# Patient Record
Sex: Female | Born: 1960 | Race: White | Hispanic: No | Marital: Married | Smoking: Never smoker
Health system: Southern US, Community
[De-identification: ages and names within clinical notes are randomized; demographics above are authoritative.]

## PROBLEM LIST (undated history)

## (undated) DIAGNOSIS — E785 Hyperlipidemia, unspecified: Secondary | ICD-10-CM

## (undated) DIAGNOSIS — E039 Hypothyroidism, unspecified: Secondary | ICD-10-CM

## (undated) DIAGNOSIS — M5030 Other cervical disc degeneration, unspecified cervical region: Secondary | ICD-10-CM

## (undated) DIAGNOSIS — J309 Allergic rhinitis, unspecified: Secondary | ICD-10-CM

## (undated) DIAGNOSIS — N200 Calculus of kidney: Secondary | ICD-10-CM

## (undated) HISTORY — PX: DILATION AND CURETTAGE OF UTERUS: SHX78

---

## 1998-07-07 ENCOUNTER — Inpatient Hospital Stay (HOSPITAL_COMMUNITY): Admission: AD | Admit: 1998-07-07 | Discharge: 1998-07-07 | Payer: Self-pay | Admitting: *Deleted

## 1999-06-20 ENCOUNTER — Other Ambulatory Visit: Admission: RE | Admit: 1999-06-20 | Discharge: 1999-06-20 | Payer: Self-pay | Admitting: Emergency Medicine

## 1999-10-21 ENCOUNTER — Encounter: Admission: RE | Admit: 1999-10-21 | Discharge: 2000-01-19 | Payer: Self-pay | Admitting: *Deleted

## 1999-12-23 ENCOUNTER — Inpatient Hospital Stay (HOSPITAL_COMMUNITY): Admission: AD | Admit: 1999-12-23 | Discharge: 1999-12-25 | Payer: Self-pay | Admitting: *Deleted

## 2000-02-03 ENCOUNTER — Other Ambulatory Visit: Admission: RE | Admit: 2000-02-03 | Discharge: 2000-02-03 | Payer: Self-pay | Admitting: Obstetrics and Gynecology

## 2000-03-24 ENCOUNTER — Encounter (INDEPENDENT_AMBULATORY_CARE_PROVIDER_SITE_OTHER): Payer: Self-pay

## 2000-03-24 ENCOUNTER — Other Ambulatory Visit: Admission: RE | Admit: 2000-03-24 | Discharge: 2000-03-24 | Payer: Self-pay | Admitting: Obstetrics and Gynecology

## 2000-04-21 ENCOUNTER — Encounter (INDEPENDENT_AMBULATORY_CARE_PROVIDER_SITE_OTHER): Payer: Self-pay | Admitting: Specialist

## 2000-04-21 ENCOUNTER — Other Ambulatory Visit: Admission: RE | Admit: 2000-04-21 | Discharge: 2000-04-21 | Payer: Self-pay | Admitting: Obstetrics and Gynecology

## 2000-09-04 ENCOUNTER — Other Ambulatory Visit: Admission: RE | Admit: 2000-09-04 | Discharge: 2000-09-04 | Payer: Self-pay | Admitting: Obstetrics and Gynecology

## 2001-03-29 ENCOUNTER — Encounter: Admission: RE | Admit: 2001-03-29 | Discharge: 2001-04-28 | Payer: Self-pay | Admitting: Family Medicine

## 2001-05-04 ENCOUNTER — Other Ambulatory Visit: Admission: RE | Admit: 2001-05-04 | Discharge: 2001-05-04 | Payer: Self-pay | Admitting: Obstetrics and Gynecology

## 2001-07-20 ENCOUNTER — Ambulatory Visit (HOSPITAL_COMMUNITY): Admission: AD | Admit: 2001-07-20 | Discharge: 2001-07-20 | Payer: Self-pay | Admitting: Obstetrics and Gynecology

## 2001-07-20 ENCOUNTER — Encounter (INDEPENDENT_AMBULATORY_CARE_PROVIDER_SITE_OTHER): Payer: Self-pay | Admitting: *Deleted

## 2001-11-02 ENCOUNTER — Other Ambulatory Visit: Admission: RE | Admit: 2001-11-02 | Discharge: 2001-11-02 | Payer: Self-pay | Admitting: Obstetrics and Gynecology

## 2002-07-05 ENCOUNTER — Encounter: Admission: RE | Admit: 2002-07-05 | Discharge: 2002-07-05 | Payer: Self-pay | Admitting: Obstetrics and Gynecology

## 2002-07-21 ENCOUNTER — Inpatient Hospital Stay (HOSPITAL_COMMUNITY): Admission: AD | Admit: 2002-07-21 | Discharge: 2002-07-21 | Payer: Self-pay | Admitting: Obstetrics and Gynecology

## 2002-07-22 ENCOUNTER — Inpatient Hospital Stay (HOSPITAL_COMMUNITY): Admission: AD | Admit: 2002-07-22 | Discharge: 2002-07-22 | Payer: Self-pay | Admitting: Obstetrics and Gynecology

## 2002-09-08 ENCOUNTER — Inpatient Hospital Stay (HOSPITAL_COMMUNITY): Admission: AD | Admit: 2002-09-08 | Discharge: 2002-09-10 | Payer: Self-pay | Admitting: Obstetrics and Gynecology

## 2002-10-24 ENCOUNTER — Other Ambulatory Visit: Admission: RE | Admit: 2002-10-24 | Discharge: 2002-10-24 | Payer: Self-pay | Admitting: Obstetrics and Gynecology

## 2005-09-02 ENCOUNTER — Encounter: Admission: RE | Admit: 2005-09-02 | Discharge: 2005-09-02 | Payer: Self-pay | Admitting: Obstetrics and Gynecology

## 2006-09-07 ENCOUNTER — Encounter: Admission: RE | Admit: 2006-09-07 | Discharge: 2006-09-07 | Payer: Self-pay | Admitting: Obstetrics and Gynecology

## 2010-08-23 NOTE — Op Note (Signed)
[facility]  Patient:    CATALEAH, STITES Visit Number: [id number] MRN: [id number]          Service Type: OBS Location: [facility] Attending Physician:  Lenoard Aden Dictated by:   Lenoard Aden, M.D. Proc. Date: 07/20/01 Admit Date:  07/20/2001                             Operative Report  POSTOPERATIVE DIAGNOSIS:      Incomplete abortion.  POSTOPERATIVE DIAGNOSIS:      Incomplete abortion.  PROCEDURE:                    Suction dilatation and curettage.  SURGEON:                      Lenoard Aden, M.D.  ANESTHESIA:                   Spinal by Ellwood Handler, M.D.  ESTIMATED BLOOD LOSS:         100 cc.  COMPLICATIONS:                None.  The patient to the recovery room in good condition.  SPECIMENS:                    Products of conception to pathology.  DESCRIPTION OF PROCEDURE:     The patient is rushed to the operating room urgently for heavy persistent vaginal bleeding, in the process of an incomplete abortion.  Approximately 200 cc noted prior to proceeding to the operating room.  Under spinal anesthetic the patient was prepped and draped in the usual sterile fashion.  Catheterized until the bladder was emptied. Examination under anesthesia reveals a midposition to retroflexed enlarged uterus.  The cervix easily dilates to a #27 Pratt dilator after 20 cc of dilute 2% Xylocaine solution used for a paracervical block.  The patient tolerating the procedure well.  At this time an 8.0 mm suction curet was placed.  A moderate amount of tissue is aspirated and sent for pathologic confirmation of products of conception and aspiration, in a four-quadrant method.  This reveals the cavity to be empty.  A blunt curettage in a four-quadrant method also reveals the cavity to be empty.  A repeat suction and curettage confirms.  Good hemostasis is confirmed.  The single tooth tenaculum and the speculum are removed.  The patient tolerates  the procedure well and was transferred to the recovery room in good condition. Dictated by:   Lenoard Aden, M.D. Attending Physician:  Lenoard Aden DD:  07/20/01 TD:  07/20/01 Job: [id number]

## 2010-08-23 NOTE — H&P (Signed)
[facility]  PatientZONA, Rebekah Thompson                            MRN: [id number] Adm. Date:  12/23/99 Attending:  Lenoard Aden, M.D. CC:         [facility]   History and Physical  CHIEF COMPLAINT:              Gestational diabetes, advanced dilatation at term.  HISTORY OF PRESENT ILLNESS:   A 50 year old female gravida 1, para 0, EDC of December 21, 1999, at 40 plus weeks who presents for induction for aforementioned indications.  PAST OBSTETRIC HISTORY:       Negative.  PAST GYNECOLOGIC HISTORY:     Negative.  FAMILY HISTORY:               Myocardial infarction, thyroid disease, diverticulitis, renal calculus, schizophrenia, and alcoholism.  PAST MEDICAL HISTORY:         The patient has had no medical or surgical hospitalizations.  PRENATAL CARE:                Remarkable for blood type A-positive, rubella immune.  Hepatitis B surface antigen negative.  HIV nonreactive.  GBS positive.  GC and Chlamydia negative.  Pregnancy complicated by a complex ovarian cyst on the left which appeared stable and was managed expectantly. The patient will have ultrasound postpartum.  PHYSICAL EXAMINATION:  GENERAL:                      She is a well-developed and well-nourished white female in apparent distress.  HEENT:                        Normal.  LUNGS:                        Clear.  HEART:                        Regular rhythm.  ABDOMEN:                      Soft, gravid, and nontender.  Estimated fetal weight 7-1/2 to 8 pounds.  CERVIX:                       Is 3-4 cm, 80% vertex, and minus 1.  Artificial rupture of membranes reveals clear fluid.  NEUROLOGIC EXAMINATION:       Intact.  EXTREMITIES:                  Showed no cords.  IMPRESSION:                   1. A 40 week intrauterine pregnancy.                               2. Gestational diabetes.                               3. Group B streptococcus positive.                     4. Ovarian mass.  5. A soft favorable cervix at term.  PLAN:                         Proceed with induction.  The patient is apprised of the risk of induction and proceeding with possible vaginal delivery.  She acknowledges and desires to proceed. DD:  12/23/99 TD:  12/23/99 Job: [id number]

## 2010-08-23 NOTE — Op Note (Signed)
   NAMEROSAMAE, ROCQUE                            ACCOUNT NO.:  [id number]   MEDICAL RECORD NO.:  [id number]                   PATIENT TYPE:  INP   LOCATION:  9126                                 FACILITY:  [facility]   PHYSICIAN:  Lenoard Aden, M.D.             DATE OF BIRTH:  1961/01/18   DATE OF PROCEDURE:  09/08/2002  DATE OF DISCHARGE:                                 OPERATIVE REPORT   OPERATIVE DELIVERY NOTE:   INDICATION FOR OPERATIVE DELIVERY:  Maternal exhaustion, prolonged second  stage.   DESCRIPTION OF PROCEDURE:  After pushing for approximately three hours  intermittently, fetal vertex noted to be ROA, less than 35 degrees, at +3  station.  A Kiwi cup is placed for low vacuum-assisted vaginal delivery on  two pulls of a full-term living female, Apgars 8 and 9.  Before the procedure,  vacuum assistance was discussed with the patient and her husbands, the risks  of cephalohematoma, scalp laceration, small incidence of intracranial  hemorrhage discussed.  They acknowledged and wished to proceed.  At the end  of the procedure the estimated blood loss was 400 mL.  No cervical or  vaginal lacerations repaired.  The midline laceration with a 3-0 Rapide and  a 4-0 Vicryl without complications.  Mother and baby tolerated the procedure  well and are recovering in good condition.                                               Lenoard Aden, M.D.    RJT/MEDQ  D:  09/08/2002  T:  09/09/2002  Job:  [id number]

## 2010-08-23 NOTE — H&P (Signed)
   Rebekah Thompson, Rebekah Thompson                            ACCOUNT NO.:  [id number]   MEDICAL RECORD NO.:  [id number]                   PATIENT TYPE:  MAT   LOCATION:  [facility]                                 FACILITY:  [facility]   PHYSICIAN:  Lenoard Aden, M.D.             DATE OF BIRTH:  05-09-1960   DATE OF ADMISSION:  09/08/2002  DATE OF DISCHARGE:                                HISTORY & PHYSICAL   CHIEF COMPLAINT:  Advanced dilatation and gestational diabetes for  induction.   HISTORY OF PRESENT ILLNESS:  The patient is a 50 year old white female, G3,  P1, EDD September 19, 2002, at 38 weeks, with a history of well controlled  gestational diabetes and advanced cervical dilatation for controlled  attempts at induction.   ALLERGIES:  SULFA.   MEDICATIONS:  1. Prenatal vitamins.  2. Levoxyl.   PAST MEDICAL HISTORY:  1. Hypothyroidism.  2. Abnormal Pap smear with loop electrosurgical excision procedure in 2002.  3. D&C for missed AB in 2003.   Pregnancy has been complicated by a pre-term cervical change, followed  expectantly, history of third trimester steroid administration.   PAST OBSTETRICAL HISTORY:  1. An 8 pound 12 ounce female born in 2001.  2. D&C status post SAB in April 2003.   LABORATORY DATA:  A blood type of A positive, Rh antibody negative, rubella  immune.  Hepatitis B surface antigen negative.  HIV declined.   PHYSICAL EXAMINATION:  GENERAL:  She is a well-developed, well-nourished  white female in no acute distress.  HEENT:  Normal.  LUNGS:  Clear.  HEART:  Regular rate and rhythm.  ABDOMEN:  Soft, gravid, nontender.  PELVIC:  Estimated fetal weight 8.5 to 9 pounds.  Cervix is 4 to 5 cm, 1 cm  long, vertex, and -1.  EXTREMITIES:  No cords.  NEUROLOGIC:  Nonfocal.    IMPRESSION:  A 38-week intrauterine pregnancy for induction.  1. Gestational diabetes, stable.  2. Advanced dilatation.   PLAN:  Proceed with artificial rupture of membranes and attempts at  vaginal  delivery.                                                Lenoard Aden, M.D.    RJT/MEDQ  D:  09/07/2002  T:  09/07/2002  Job:  [id number]

## 2012-06-15 ENCOUNTER — Ambulatory Visit: Payer: Self-pay | Admitting: Obstetrics and Gynecology

## 2012-12-08 ENCOUNTER — Encounter: Payer: Self-pay | Admitting: General Practice

## 2013-01-04 ENCOUNTER — Ambulatory Visit: Payer: Self-pay | Admitting: General Practice

## 2013-01-04 LAB — HCG, QUANTITATIVE, PREGNANCY: Beta Hcg, Quant.: 1 m[IU]/mL

## 2013-01-05 ENCOUNTER — Encounter: Payer: Self-pay | Admitting: General Practice

## 2013-01-28 ENCOUNTER — Ambulatory Visit: Payer: Self-pay | Admitting: General Practice

## 2013-02-05 ENCOUNTER — Encounter: Payer: Self-pay | Admitting: General Practice

## 2015-08-17 ENCOUNTER — Other Ambulatory Visit: Payer: Self-pay | Admitting: Family Medicine

## 2015-08-17 DIAGNOSIS — M542 Cervicalgia: Secondary | ICD-10-CM

## 2015-08-17 DIAGNOSIS — M5030 Other cervical disc degeneration, unspecified cervical region: Secondary | ICD-10-CM

## 2015-09-06 ENCOUNTER — Ambulatory Visit
Admission: RE | Admit: 2015-09-06 | Discharge: 2015-09-06 | Disposition: A | Discharge status: Home / Self Care | Payer: BLUE CROSS/BLUE SHIELD | Source: Ambulatory Visit | Attending: Family Medicine | Admitting: Family Medicine

## 2015-09-06 DIAGNOSIS — M542 Cervicalgia: Secondary | ICD-10-CM | POA: Diagnosis present

## 2015-09-06 DIAGNOSIS — M5010 Cervical disc disorder with radiculopathy, unspecified cervical region: Secondary | ICD-10-CM | POA: Diagnosis present

## 2015-09-06 DIAGNOSIS — M5124 Other intervertebral disc displacement, thoracic region: Secondary | ICD-10-CM | POA: Insufficient documentation

## 2015-09-06 DIAGNOSIS — M50223 Other cervical disc displacement at C6-C7 level: Secondary | ICD-10-CM | POA: Diagnosis not present

## 2015-09-06 DIAGNOSIS — M47812 Spondylosis without myelopathy or radiculopathy, cervical region: Secondary | ICD-10-CM | POA: Insufficient documentation

## 2015-09-06 DIAGNOSIS — M5030 Other cervical disc degeneration, unspecified cervical region: Secondary | ICD-10-CM

## 2015-09-25 ENCOUNTER — Encounter: Payer: Self-pay | Admitting: *Deleted

## 2015-09-26 ENCOUNTER — Ambulatory Visit: Payer: BLUE CROSS/BLUE SHIELD | Admitting: Anesthesiology

## 2015-09-26 ENCOUNTER — Ambulatory Visit
Admission: RE | Admit: 2015-09-26 | Discharge: 2015-09-26 | Disposition: A | Discharge status: Home / Self Care | Payer: BLUE CROSS/BLUE SHIELD | Source: Ambulatory Visit | Attending: Unknown Physician Specialty | Admitting: Unknown Physician Specialty

## 2015-09-26 ENCOUNTER — Encounter: Payer: Self-pay | Admitting: *Deleted

## 2015-09-26 ENCOUNTER — Encounter
Admission: RE | Disposition: A | Discharge status: Home / Self Care | Payer: Self-pay | Source: Ambulatory Visit | Attending: Unknown Physician Specialty

## 2015-09-26 DIAGNOSIS — E785 Hyperlipidemia, unspecified: Secondary | ICD-10-CM | POA: Diagnosis not present

## 2015-09-26 DIAGNOSIS — K640 First degree hemorrhoids: Secondary | ICD-10-CM | POA: Insufficient documentation

## 2015-09-26 DIAGNOSIS — Z79899 Other long term (current) drug therapy: Secondary | ICD-10-CM | POA: Diagnosis not present

## 2015-09-26 DIAGNOSIS — Z1211 Encounter for screening for malignant neoplasm of colon: Secondary | ICD-10-CM | POA: Diagnosis not present

## 2015-09-26 DIAGNOSIS — E039 Hypothyroidism, unspecified: Secondary | ICD-10-CM | POA: Insufficient documentation

## 2015-09-26 HISTORY — DX: Other cervical disc degeneration, unspecified cervical region: M50.30

## 2015-09-26 HISTORY — DX: Allergic rhinitis, unspecified: J30.9

## 2015-09-26 HISTORY — DX: Hyperlipidemia, unspecified: E78.5

## 2015-09-26 HISTORY — DX: Calculus of kidney: N20.0

## 2015-09-26 HISTORY — PX: COLONOSCOPY WITH PROPOFOL: SHX5780

## 2015-09-26 HISTORY — DX: Hypothyroidism, unspecified: E03.9

## 2015-09-26 SURGERY — COLONOSCOPY WITH PROPOFOL
Anesthesia: General

## 2015-09-26 MED ORDER — SODIUM CHLORIDE 0.9 % IV SOLN
INTRAVENOUS | Status: DC
  Administered 2015-09-26: 1000 mL via INTRAVENOUS

## 2015-09-26 MED ORDER — LIDOCAINE 2% (20 MG/ML) 5 ML SYRINGE
INTRAMUSCULAR | Status: DC | PRN
Start: 2015-09-26 — End: 2015-09-26
  Administered 2015-09-26: 40 mg via INTRAVENOUS

## 2015-09-26 MED ORDER — PROPOFOL 500 MG/50ML IV EMUL
INTRAVENOUS | Status: DC | PRN
  Administered 2015-09-26: 150 ug/kg/min via INTRAVENOUS

## 2015-09-26 MED ORDER — MIDAZOLAM HCL 5 MG/5ML IJ SOLN
INTRAMUSCULAR | Status: DC | PRN
  Administered 2015-09-26: 1 mg via INTRAVENOUS

## 2015-09-26 MED ORDER — SODIUM CHLORIDE 0.9 % IV SOLN
INTRAVENOUS | Status: DC
  Administered 2015-09-26: 11:00:00 via INTRAVENOUS

## 2015-09-26 MED ORDER — PROPOFOL 10 MG/ML IV BOLUS
INTRAVENOUS | Status: DC | PRN
  Administered 2015-09-26: 100 mg via INTRAVENOUS

## 2015-09-26 MED ORDER — FENTANYL CITRATE (PF) 100 MCG/2ML IJ SOLN
INTRAMUSCULAR | Status: DC | PRN
  Administered 2015-09-26: 50 ug via INTRAVENOUS

## 2015-09-26 MED ORDER — PHENYLEPHRINE HCL 10 MG/ML IJ SOLN
INTRAMUSCULAR | Status: DC | PRN
  Administered 2015-09-26 (×3): 100 ug via INTRAVENOUS

## 2015-09-26 NOTE — Op Note (Addendum)
[facility] Gastroenterology Patient Name: Rebekah Thompson Procedure Date: 09/26/2015 10:56 AM MRN: [id number] Account #: [id number] Date of Birth: 26-Nov-1960 Admit Type: Outpatient Age: [age] [facility] 4 Gender: Female Note Status: Finalized Procedure:            Colonoscopy Indications:          Screening for colorectal malignant neoplasm Providers:            Scot Junobert T. Elliott, MD Referring MD:         Gilles ChiquitoJoseph H. Rabinowitz, MD Medicines:            Propofol per Anesthesia Complications:        No immediate complications. Procedure:            Pre-Anesthesia Assessment:                       - After reviewing the risks and benefits, the patient                        was deemed in satisfactory condition to undergo the                        procedure.                       After obtaining informed consent, the colonoscope was                        passed under direct vision. Throughout the procedure,                        the patient's blood pressure, pulse, and oxygen                        saturations were monitored continuously. The                        Colonoscope was introduced through the anus and                        advanced to the the cecum, identified by appendiceal                        orifice and ileocecal valve. The colonoscopy was                        performed without difficulty. The patient tolerated the                        procedure well. The quality of the bowel preparation                        was excellent. Findings:      Internal hemorrhoids were found during endoscopy. The hemorrhoids were       small and Grade I (internal hemorrhoids that do not prolapse).      The exam was otherwise without abnormality. Impression:           - Internal hemorrhoids.                       - The examination was otherwise normal.                       -  No specimens collected. Recommendation:       - Repeat colonoscopy in 10 years for  screening purposes. Procedure Code(s):    --- Professional ---                       [phone], Colonoscopy, flexible; diagnostic, including                        collection of specimen(s) by brushing or washing, when                        performed (separate procedure) Diagnosis Code(s):    --- Professional ---                       Z12.11, Encounter for screening for malignant neoplasm                        of colon                       K64.0, First degree hemorrhoids CPT copyright 2016 American Medical Association. All rights reserved. The codes documented in this report are preliminary and upon coder review may  be revised to meet current compliance requirements. Scot Jun, MD 09/26/2015 11:25:25 AM This report has been signed electronically. Number of Addenda: 0 Note Initiated On: 09/26/2015 10:56 AM Scope Withdrawal Time: 0 hours 10 minutes 5 seconds  Total Procedure Duration: 0 hours 20 minutes 1 second       [facility]

## 2015-09-26 NOTE — Anesthesia Postprocedure Evaluation (Signed)
Anesthesia Post Note  Patient: Rebekah Thompson  Procedure(s) Performed: Procedure(s) (LRB): COLONOSCOPY WITH PROPOFOL (N/A)  Patient location during evaluation: PACU Anesthesia Type: General Level of consciousness: awake Pain management: satisfactory to patient Vital Signs Assessment: post-procedure vital signs reviewed and stable Respiratory status: nonlabored ventilation Cardiovascular status: blood pressure returned to baseline Anesthetic complications: no    Last Vitals:  Filed Vitals:   09/26/15 1138 09/26/15 1148  BP: 93/51 103/51  Pulse: 65 68  Temp:    Resp: 22 14    Last Pain:  Filed Vitals:   09/26/15 1150  PainSc: 7                  VAN STAVEREN,Ehren Berisha

## 2015-09-26 NOTE — H&P (Signed)
   Primary Care Physician:  Gilles ChiquitoJoseph H Rabinowitz, MD Primary Gastroenterologist:  Dr. Mechele CollinElliott  Pre-Procedure History & Physical: HPI:  Rebekah Thompson is a 55 y.o. female is here for an colonoscopy.   Past Medical History  Diagnosis Date  . Hypothyroidism   . DDD (degenerative disc disease), cervical   . Hyperlipidemia   . Allergic rhinitis   . Kidney stones     Past Surgical History  Procedure Laterality Date  . Dilation and curettage of uterus      Prior to Admission medications   Medication Sig Start Date End Date Taking? Authorizing Provider  levothyroxine (LEVOXYL) 100 MCG tablet Take 100 mcg by mouth daily before breakfast.   Yes Historical Provider, MD  meloxicam (MOBIC) 15 MG tablet Take 15 mg by mouth daily.   Yes Historical Provider, MD  rosuvastatin (CRESTOR) 5 MG tablet Take 5 mg by mouth daily.   Yes Historical Provider, MD    Allergies as of 09/07/2015  . (Not on File)    History reviewed. No pertinent family history.  Social History   Social History  . Marital Status: Married    Spouse Name: N/A  . Number of Children: N/A  . Years of Education: N/A   Occupational History  . Not on file.   Social History Main Topics  . Smoking status: Never Smoker   . Smokeless tobacco: Not on file  . Alcohol Use: Not on file  . Drug Use: Not on file  . Sexual Activity: Not on file   Other Topics Concern  . Not on file   Social History Narrative    Review of Systems: See HPI, otherwise negative ROS  Physical Exam: BP 109/66 mmHg  Pulse 82  Temp(Src) 98.6 F (37 C) (Tympanic)  Resp 16  Ht 5\' 5"  (1.651 m)  Wt 59.421 kg (131 lb)  BMI 21.80 kg/m2  SpO2 100% General:   Alert,  pleasant and cooperative in NAD Head:  Normocephalic and atraumatic. Neck:  Supple; no masses or thyromegaly. Lungs:  Clear throughout to auscultation.    Heart:  Regular rate and rhythm. Abdomen:  Soft, nontender and nondistended. Normal bowel sounds, without guarding, and  without rebound.   Neurologic:  Alert and  oriented x4;  grossly normal neurologically.  Impression/Plan: Rebekah Thompson is here for an colonoscopy to be performed for screening colonoscopy  Risks, benefits, limitations, and alternatives regarding  colonoscopy have been reviewed with the patient.  Questions have been answered.  All parties agreeable.   Lynnae PrudeELLIOTT, ROBERT, MD  09/26/2015, 10:49 AM

## 2015-09-26 NOTE — Transfer of Care (Signed)
Immediate Anesthesia Transfer of Care Note  Patient: Rebekah Thompson  Procedure(s) Performed: Procedure(s): COLONOSCOPY WITH PROPOFOL (N/A)  Patient Location: PACU and Endoscopy Unit  Anesthesia Type:General  Level of Consciousness: awake and sedated  Airway & Oxygen Therapy: Patient Spontanous Breathing and Patient connected to nasal cannula oxygen  Post-op Assessment: Report given to RN and Post -op Vital signs reviewed and stable  Post vital signs: Reviewed and stable  Last Vitals:  Filed Vitals:   09/26/15 1037 09/26/15 1128  BP: 109/66   Pulse: 82   Temp: 37 C 35.9 C  Resp: 16     Last Pain:  Filed Vitals:   09/26/15 1129  PainSc: 7          Complications: No apparent anesthesia complications

## 2015-09-26 NOTE — Anesthesia Preprocedure Evaluation (Signed)
Anesthesia Evaluation  Patient identified by MRN, date of birth, ID band Patient awake    Reviewed: Allergy & Precautions, NPO status , Patient's Chart, lab work & pertinent test results  History of Anesthesia Complications Negative for: history of anesthetic complications  Airway Mallampati: I       Dental  (+) Teeth Intact   Pulmonary neg pulmonary ROS,    + rhonchi        Cardiovascular negative cardio ROS   Rhythm:Regular     Neuro/Psych negative neurological ROS     GI/Hepatic   Endo/Other  Hypothyroidism   Renal/GU   negative genitourinary   Musculoskeletal   Abdominal Normal abdominal exam  (+)   Peds negative pediatric ROS (+)  Hematology negative hematology ROS (+)   Anesthesia Other Findings   Reproductive/Obstetrics                             Anesthesia Physical Anesthesia Plan  ASA: II  Anesthesia Plan: General   Post-op Pain Management:    Induction: Intravenous  Airway Management Planned: Natural Airway and Nasal Cannula  Additional Equipment:   Intra-op Plan:   Post-operative Plan:   Informed Consent: I have reviewed the patients History and Physical, chart, labs and discussed the procedure including the risks, benefits and alternatives for the proposed anesthesia with the patient or authorized representative who has indicated his/her understanding and acceptance.     Plan Discussed with: CRNA  Anesthesia Plan Comments:         Anesthesia Quick Evaluation

## 2015-09-27 ENCOUNTER — Encounter: Payer: Self-pay | Admitting: Unknown Physician Specialty

## 2015-12-11 ENCOUNTER — Other Ambulatory Visit: Payer: Self-pay | Admitting: Obstetrics and Gynecology

## 2015-12-11 DIAGNOSIS — R928 Other abnormal and inconclusive findings on diagnostic imaging of breast: Secondary | ICD-10-CM

## 2015-12-14 ENCOUNTER — Ambulatory Visit
Admission: RE | Admit: 2015-12-14 | Discharge: 2015-12-14 | Disposition: A | Discharge status: Home / Self Care | Payer: BLUE CROSS/BLUE SHIELD | Source: Ambulatory Visit | Attending: Obstetrics and Gynecology | Admitting: Obstetrics and Gynecology

## 2015-12-14 DIAGNOSIS — R928 Other abnormal and inconclusive findings on diagnostic imaging of breast: Secondary | ICD-10-CM

## 2017-06-20 IMAGING — MR MR CERVICAL SPINE W/O CM
5 series · 34 of 48 positions shown · non-contrast
Comparison: Limited correlation made with paranasal sinus
radiographs 01/28/2013 and brain MRI 01/04/2013.

CLINICAL DATA: Chronic neck pain with painful range of motion,
tingling in the right hand. Worsening symptoms over the last 3
months. No acute injury.

EXAM:
MRI CERVICAL SPINE WITHOUT CONTRAST
TECHNIQUE: Multiplanar, multisequence MR imaging of the cervical spine was
performed. No intravenous contrast was administered.

[Series 2: T2 · sagittal · 3.0mm · 0.56mm/px · 8 of 15 slices shown (1 of 2)]
[im 1/15]
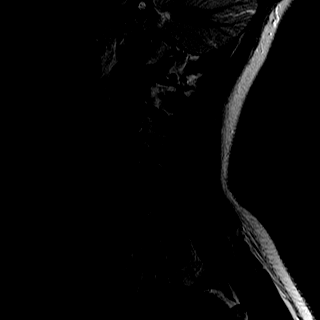
[im 3/15]
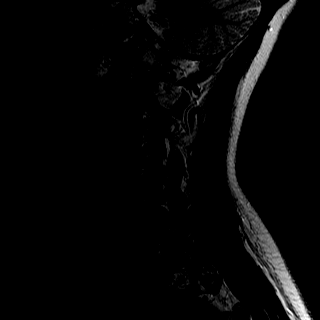
[im 5/15]
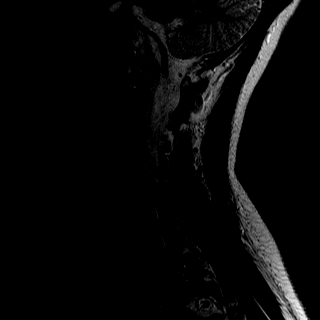
[im 7/15]
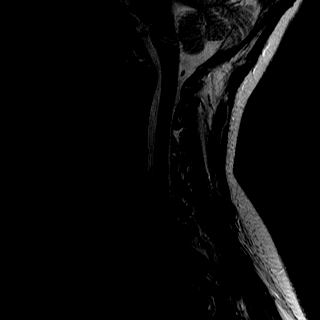
[im 9/15]
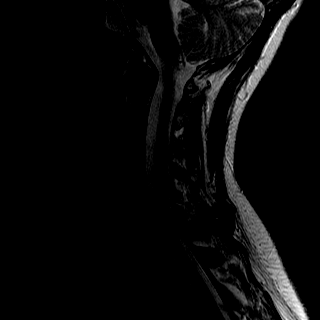
[im 11/15]
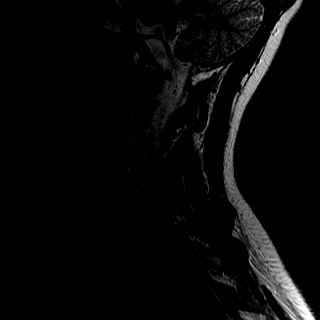
[im 13/15]
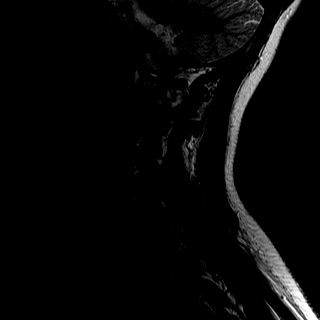
[im 15/15]
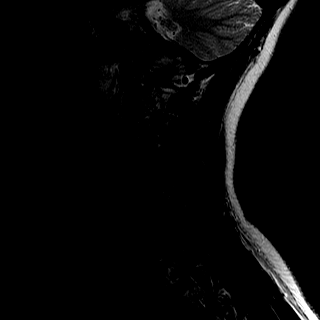

[Series 3: T1 · sagittal · 3.0mm · 0.70mm/px · 7 of 15 slices shown]
[im 1/15]
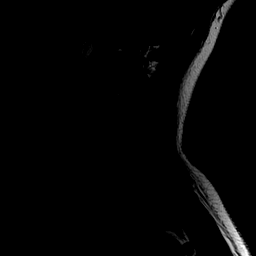
[im 3/15]
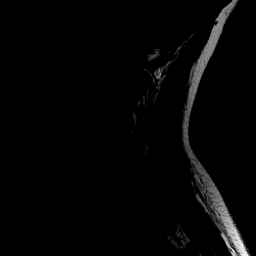
[im 5/15]
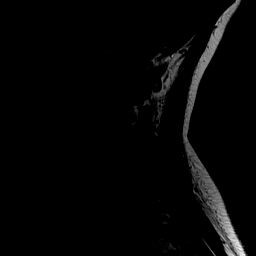
[im 8/15]
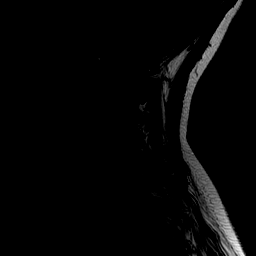
[im 10/15]
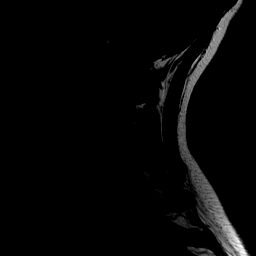
[im 12/15]
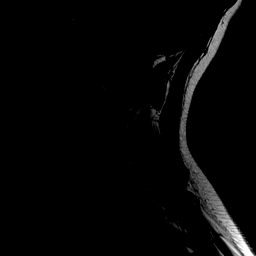
[im 15/15]
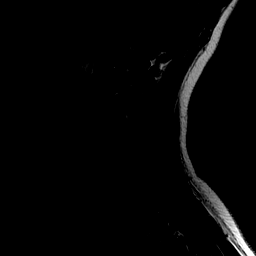

[Series 5: STIR · sagittal · 3.0mm · 0.35mm/px · 7 of 15 slices shown]
[im 1/15]
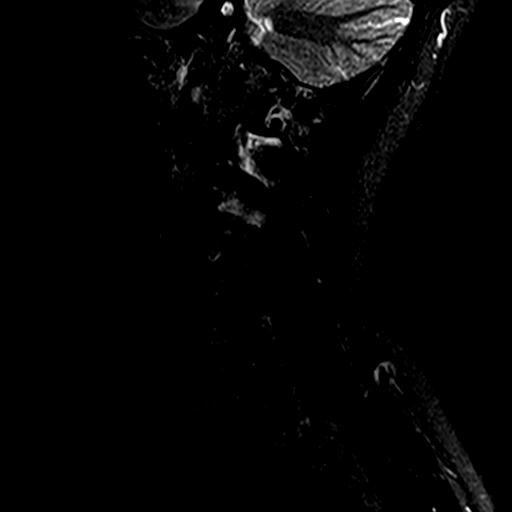
[im 3/15]
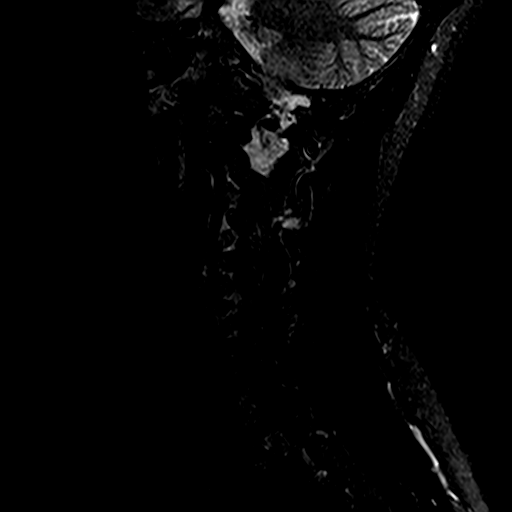
[im 5/15]
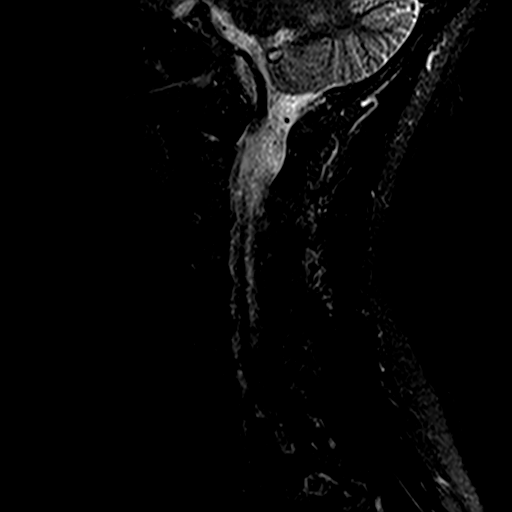
[im 8/15]
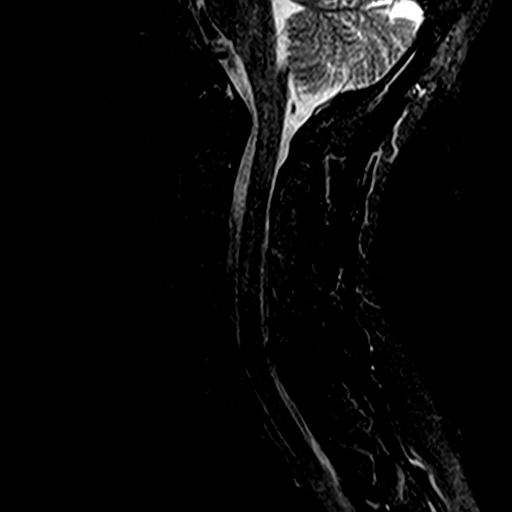
[im 10/15]
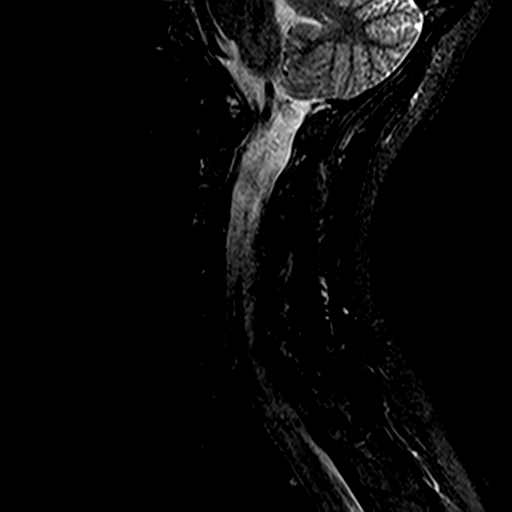
[im 12/15]
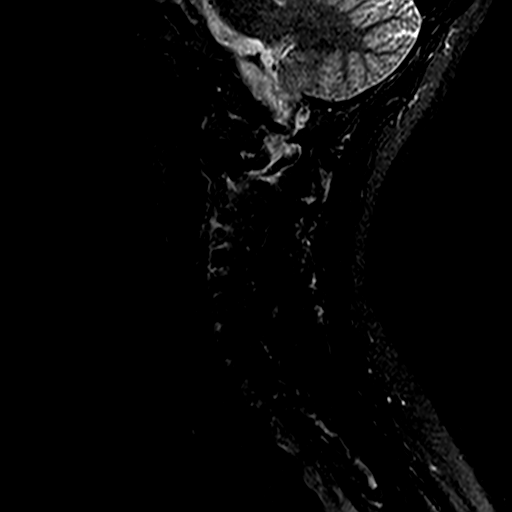
[im 15/15]
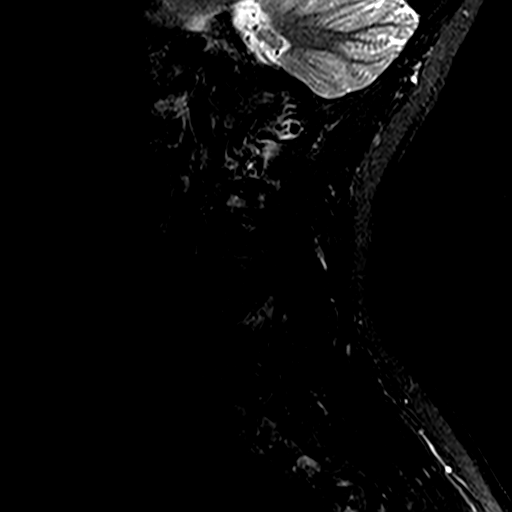

[Series 7: T2 · axial · 3.0mm · 0.70mm/px · z∈[-33,+62]mm · 9 of 26 slices shown (2 of 2)]
[im 1/26]
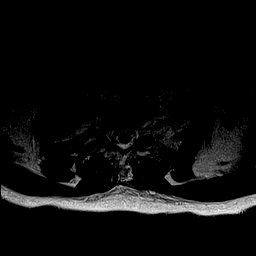
[im 5/26]
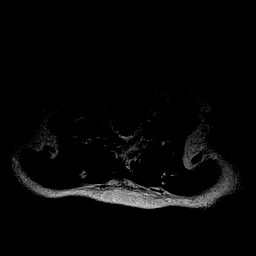
[im 9/26]
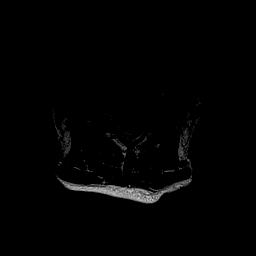
[im 11/26]
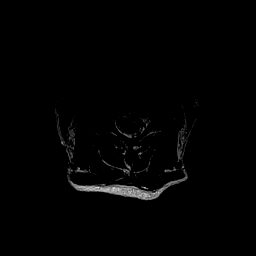
[im 13/26]
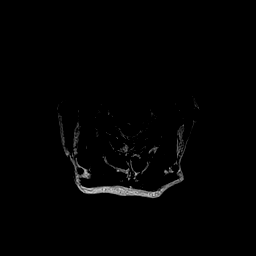
[im 15/26]
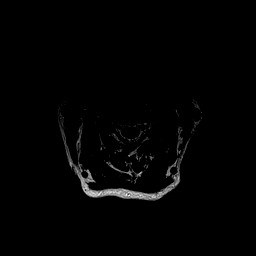
[im 17/26]
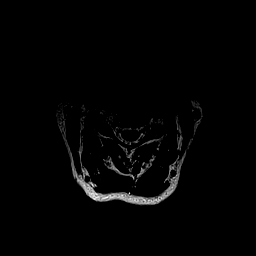
[im 21/26]
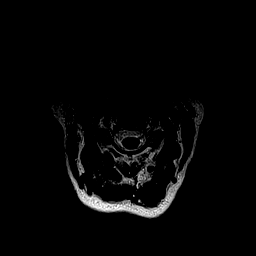
[im 26/26]
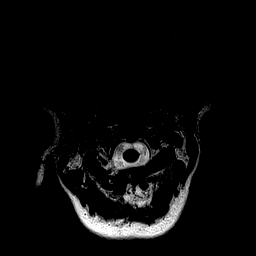

[Series 9: mpgr ax_fil_1 · axial · 3.0mm · 0.35mm/px · z∈[-33,-2]mm · 3 of 26 slices shown]
[im 1/26]
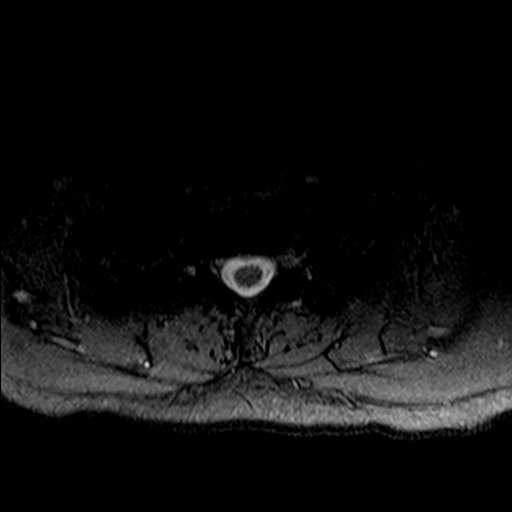
[im 5/26]
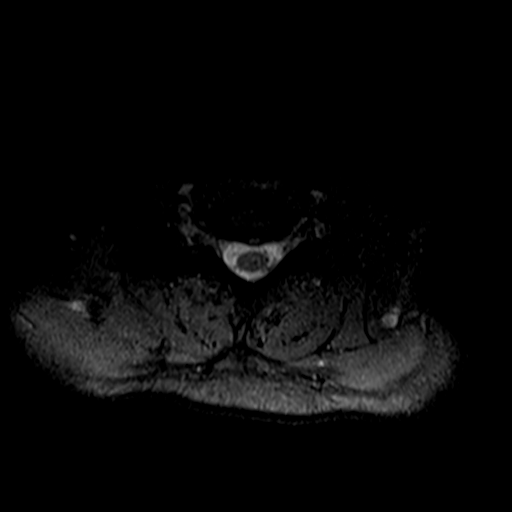
[im 9/26]
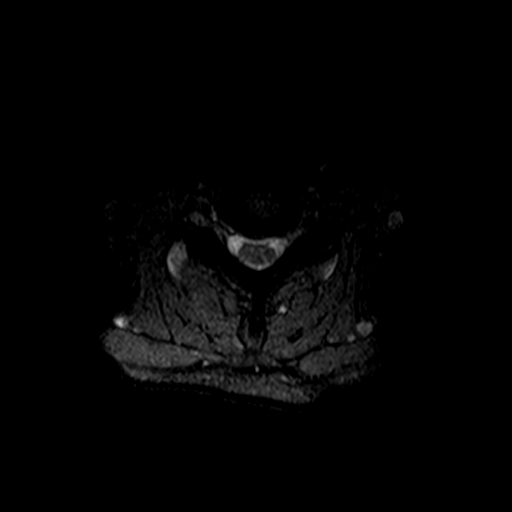

[34 of 48 positions shown; findings below may reference images not displayed]

FINDINGS: Alignment: Normal.

Vertebrae: No acute or suspicious osseous findings.

Cord: Normal in signal and caliber.

Posterior Fossa, vertebral arteries, paraspinal tissues: Visualized
portions of the posterior fossa and paraspinal soft tissues appear
unremarkable. Bilateral vertebral artery flow voids.

Disc levels:

No significant disc space findings at or above C3-4.

C4-5: Minimal disc bulging and uncinate spurring. No cord deformity
or foraminal compromise.

C5-6: There is loss of disc height with posterior osteophytes and
uncinate spurring. Mild foraminal narrowing is present bilaterally.
There is no cord deformity.

C6-7: There is loss of disc height with a shallow central disc
protrusion and mild uncinate spurring. There is mild left foraminal
narrowing. No cord deformity.

C7-T1: Small central disc protrusion. No cord deformity or foraminal
compromise.

T1-2: Small central disc protrusion. No cord deformity or foraminal
compromise.
IMPRESSION: 1. No acute findings. No cord deformity or high-grade foraminal
narrowing.
2. Mild spondylosis, primarily at C5-6 and C6-7, with mild foraminal
narrowing as described.
3. Small disc protrusions from C6-7 through T1-2.
